# Patient Record
Sex: Male | Born: 1995 | Marital: Single | Smoking: Never smoker
Health system: Southern US, Community
[De-identification: ages and names within clinical notes are randomized; demographics above are authoritative.]

## PROBLEM LIST (undated history)

## (undated) DIAGNOSIS — J45909 Unspecified asthma, uncomplicated: Secondary | ICD-10-CM

## (undated) DIAGNOSIS — Z905 Acquired absence of kidney: Secondary | ICD-10-CM

## (undated) HISTORY — PX: TONSILLECTOMY: SUR1361

## (undated) HISTORY — PX: HERNIA REPAIR: SHX51

---

## 2016-01-12 ENCOUNTER — Other Ambulatory Visit: Payer: Self-pay | Admitting: Nurse Practitioner

## 2016-01-12 DIAGNOSIS — R1084 Generalized abdominal pain: Secondary | ICD-10-CM

## 2016-01-15 ENCOUNTER — Ambulatory Visit
Admission: RE | Admit: 2016-01-15 | Discharge: 2016-01-15 | Disposition: A | Discharge status: Home / Self Care | Payer: BLUE CROSS/BLUE SHIELD | Source: Ambulatory Visit | Attending: Nurse Practitioner | Admitting: Nurse Practitioner

## 2016-01-15 DIAGNOSIS — R1084 Generalized abdominal pain: Secondary | ICD-10-CM

## 2016-01-15 HISTORY — DX: Unspecified asthma, uncomplicated: J45.909

## 2016-01-15 LAB — POCT I-STAT CREATININE: Creatinine, Ser: 0.9 mg/dL (ref 0.61–1.24)

## 2016-01-15 MED ORDER — IOPAMIDOL (ISOVUE-300) INJECTION 61%
85.0000 mL | Freq: Once | INTRAVENOUS | Status: AC | PRN
  Administered 2016-01-15: 75 mL via INTRAVENOUS

## 2016-01-22 ENCOUNTER — Other Ambulatory Visit: Payer: Self-pay | Admitting: Nurse Practitioner

## 2016-01-22 DIAGNOSIS — IMO0002 Reserved for concepts with insufficient information to code with codable children: Secondary | ICD-10-CM

## 2016-01-22 DIAGNOSIS — E298 Other testicular dysfunction: Secondary | ICD-10-CM

## 2016-01-22 DIAGNOSIS — R229 Localized swelling, mass and lump, unspecified: Secondary | ICD-10-CM

## 2016-01-25 ENCOUNTER — Ambulatory Visit
Admission: RE | Admit: 2016-01-25 | Discharge: 2016-01-25 | Disposition: A | Discharge status: Home / Self Care | Payer: BLUE CROSS/BLUE SHIELD | Source: Ambulatory Visit | Attending: Nurse Practitioner | Admitting: Nurse Practitioner

## 2016-01-25 DIAGNOSIS — E298 Other testicular dysfunction: Secondary | ICD-10-CM | POA: Insufficient documentation

## 2016-01-25 DIAGNOSIS — R229 Localized swelling, mass and lump, unspecified: Secondary | ICD-10-CM

## 2016-01-25 DIAGNOSIS — I861 Scrotal varices: Secondary | ICD-10-CM | POA: Diagnosis not present

## 2016-01-25 DIAGNOSIS — IMO0002 Reserved for concepts with insufficient information to code with codable children: Secondary | ICD-10-CM

## 2016-08-07 IMAGING — US US ART/VEN ABD/PELV/SCROTUM DOPPLER LTD
1 series · 13 of 25 positions shown · non-contrast
Comparison: None.

CLINICAL DATA: Bilateral pea-sized scrotal masses which are
intermittently painful. No erythema or swelling of the scrotum is
reported.

EXAM:
SCROTAL ULTRASOUND
DOPPLER ULTRASOUND OF THE TESTICLES
TECHNIQUE: Complete ultrasound examination of the testicles, epididymis, and
other scrotal structures was performed. Color and spectral Doppler
ultrasound were also utilized to evaluate blood flow to the
testicles.

[Series 1: us art/ven abd/pelv/scrotum doppler ltd · 0.08mm/px · 13 of 51 slices shown]
[im 1/51]
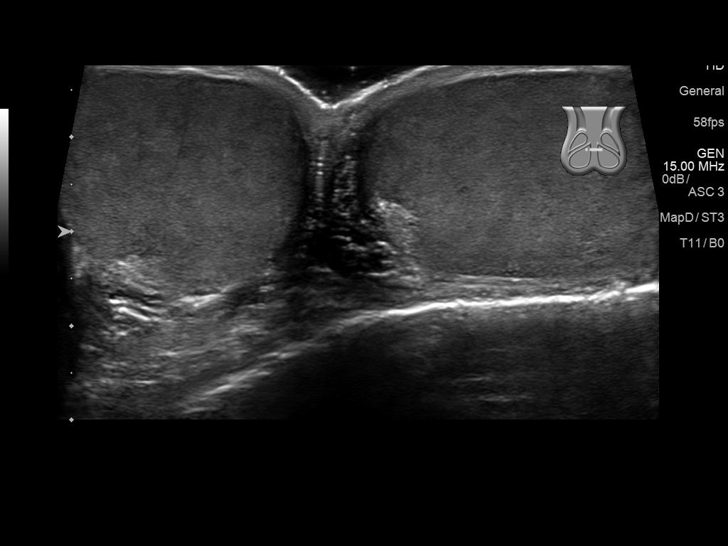
[im 5/51]
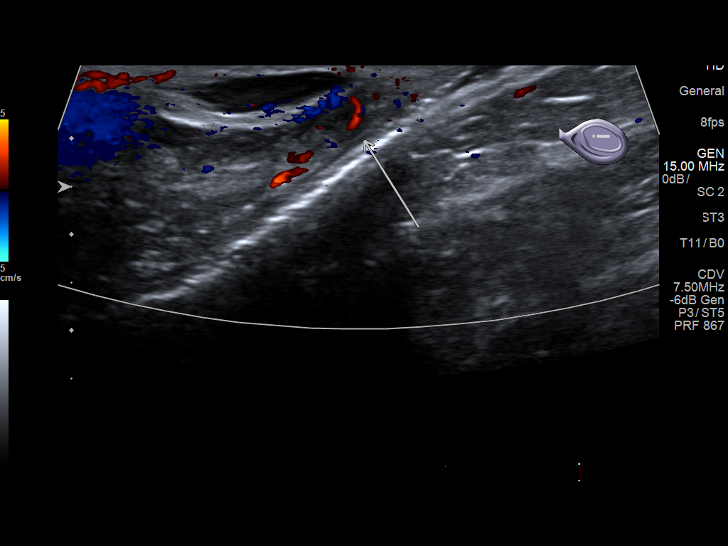
[im 9/51]
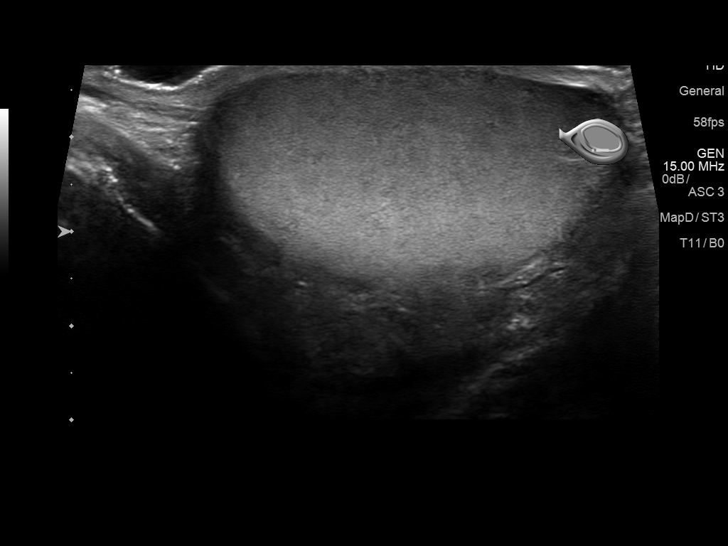
[im 13/51]
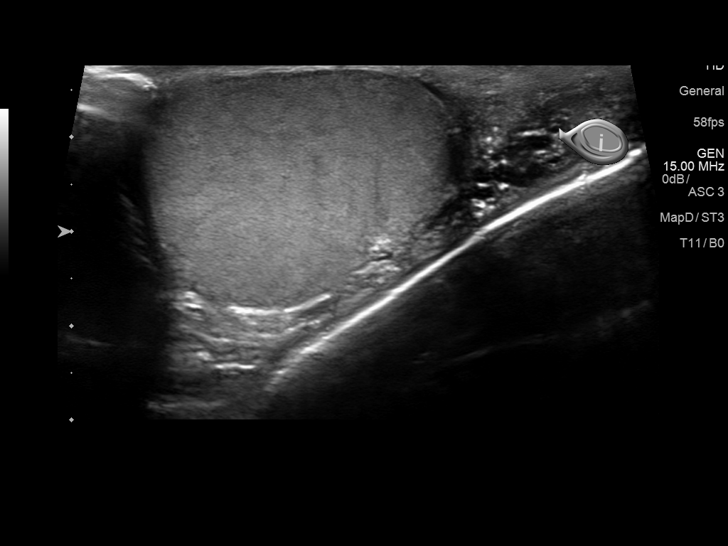
[im 17/51]
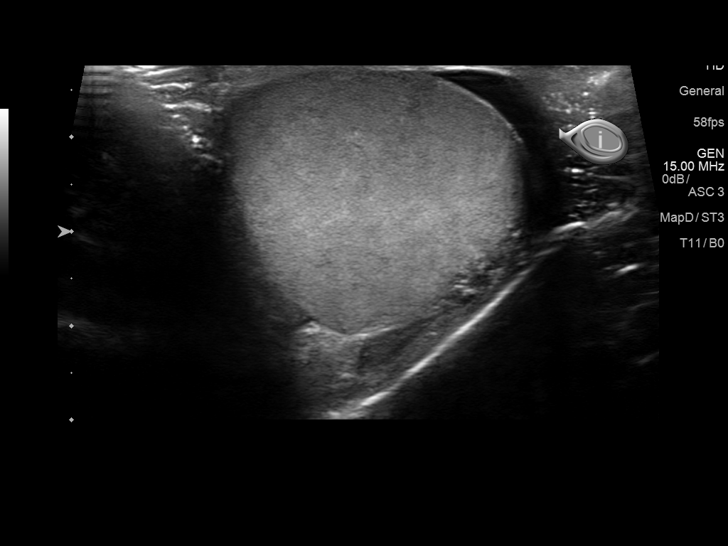
[im 21/51]
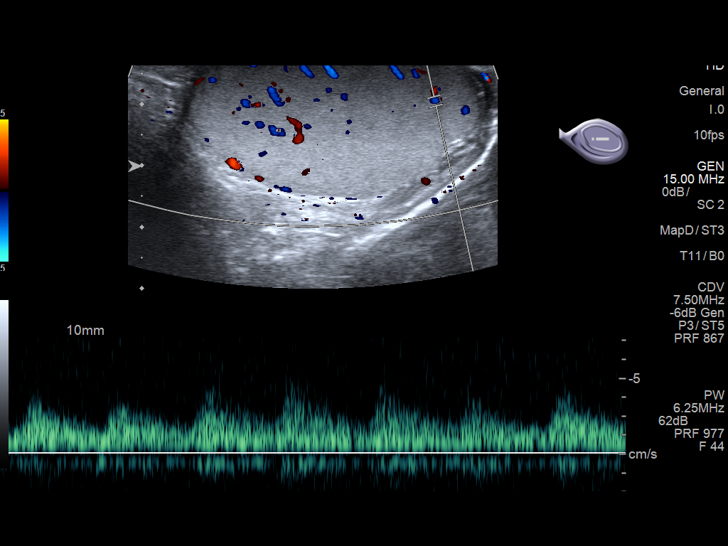
[im 26/51]
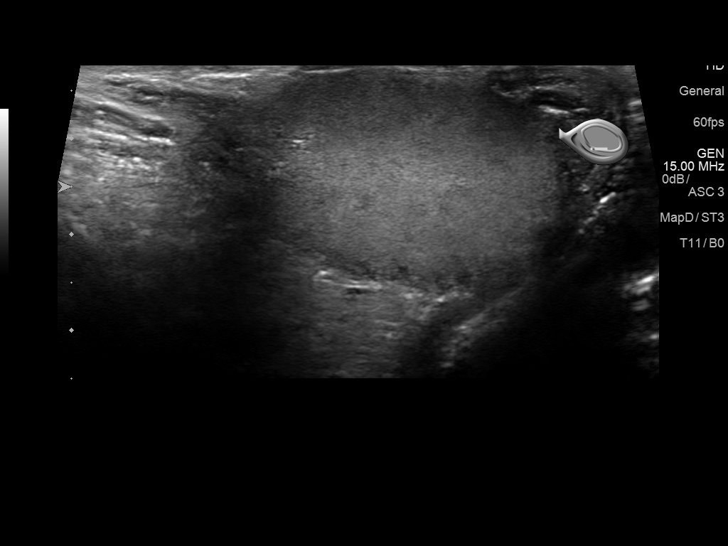
[im 30/51]
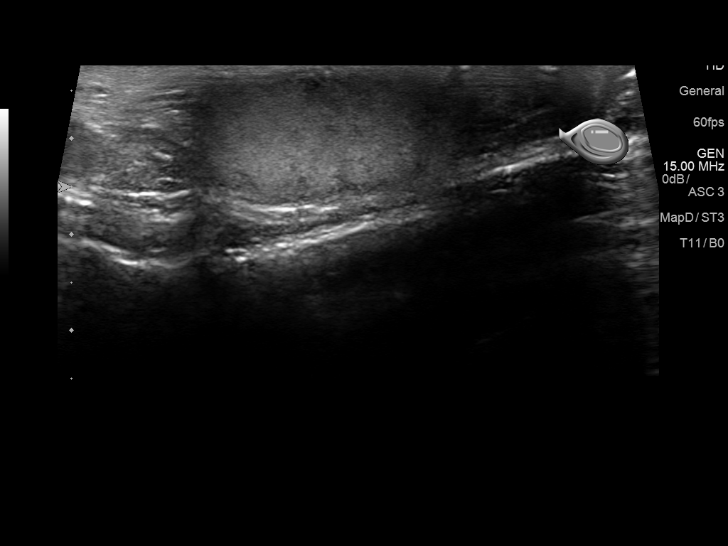
[im 34/51]
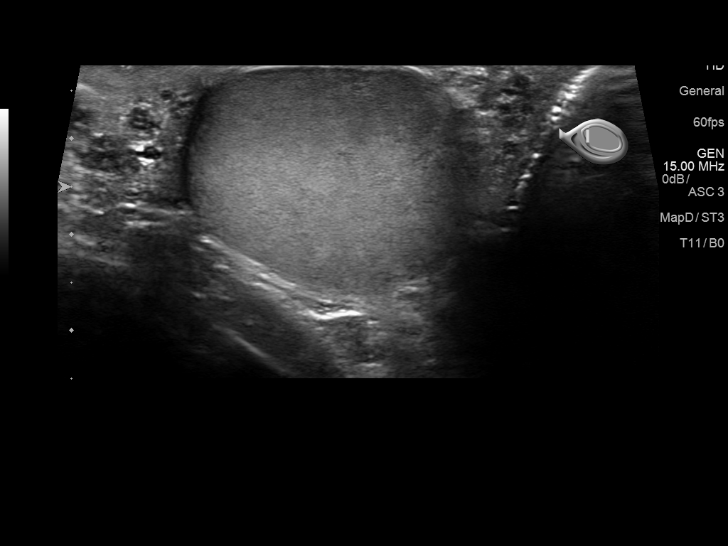
[im 38/51]
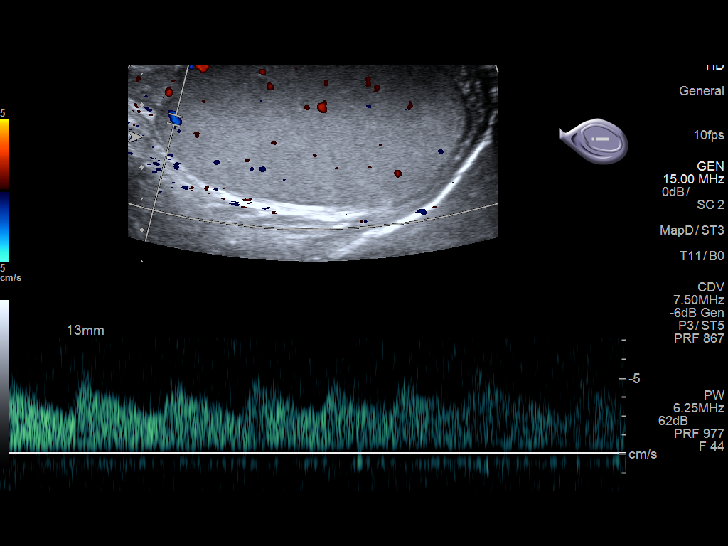
[im 42/51]
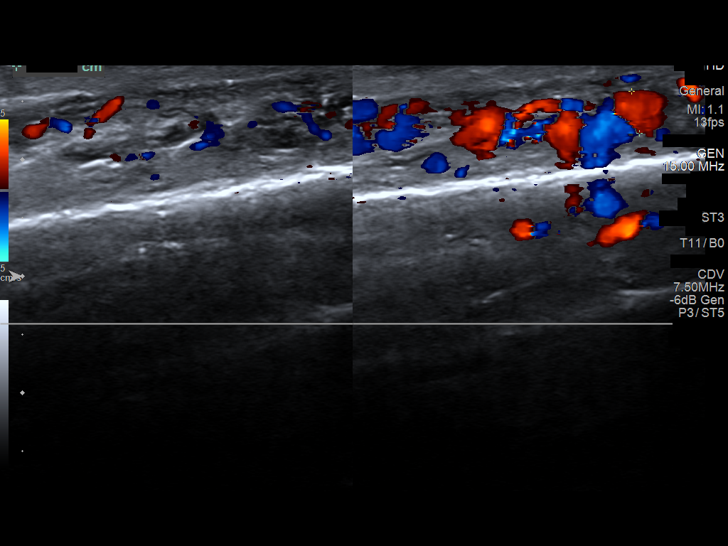
[im 46/51]
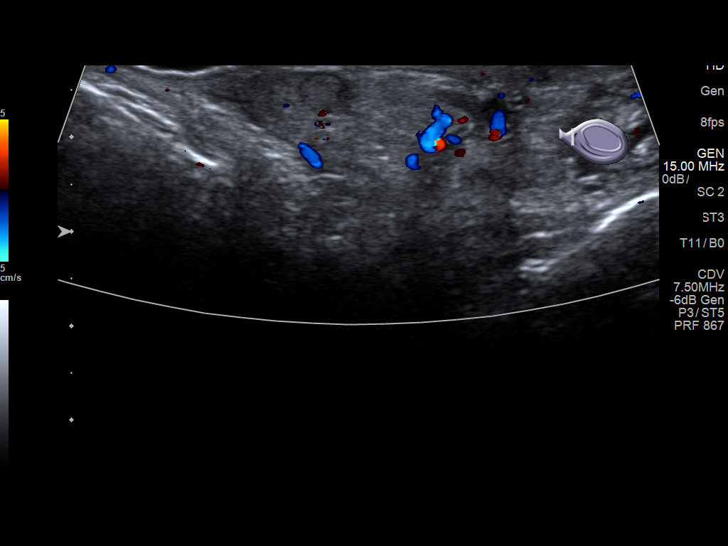
[im 51/51]
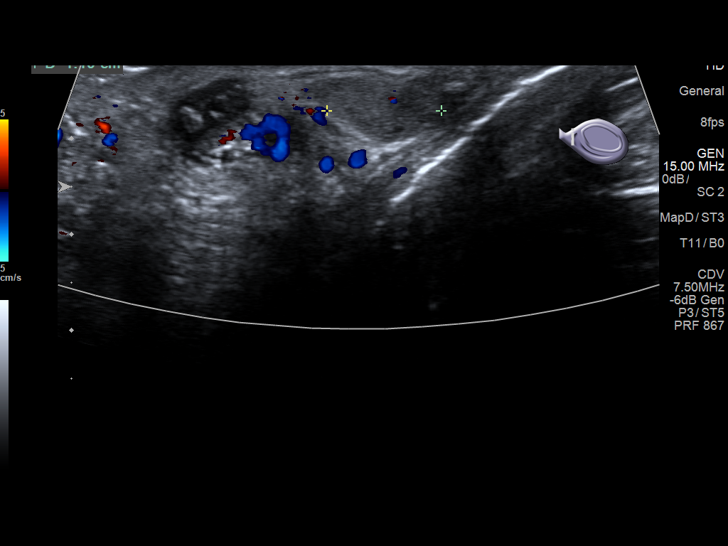

[13 of 25 positions shown; findings below may reference images not displayed]

FINDINGS: Right testicle

Measurements: 4.7 x 2.2 x 3.2 cm. No mass or microlithiasis
visualized.

Left testicle

Measurements: 5.1 x 2.0 x 3.0 cm. No mass or microlithiasis
visualized.

Right epididymis:  Normal in size and appearance.

Left epididymis:  Normal in size and appearance.

Hydrocele:  None visualized.

Varicocele: There are bilateral varicoceles slightly larger on the
right than on the left. These correspond to the areas of palpable
nodularity.

Pulsed Doppler interrogation of both testes demonstrates normal low
resistance arterial and venous waveforms bilaterally.
IMPRESSION: 1. No intra testicular masses nor evidence of testicular torsion.
The epididymal structures are normal. No other scrotal masses are
observed.
2. Bilateral varicoceles slightly larger on the right than on the
left which correspond to the palpable findings.

## 2018-02-15 IMAGING — CT CT ABD-PELV W/ CM
1 of 2 series · 15 of 32 positions shown, 19 images · IV contrast (APPLIED)
Comparison: None.

CLINICAL DATA: Left lower quadrant pain for 1 week. Pain with
breathing. Per report, the patient was born with only a right
kidney.

EXAM:
CT ABDOMEN AND PELVIS WITH CONTRAST
TECHNIQUE: Multidetector CT imaging of the abdomen and pelvis was performed
using the standard protocol following bolus administration of
intravenous contrast.
CONTRAST:  75mL [id number] IOPAMIDOL ([id number]) INJECTION 61%

[Series 2: axial st · axial · 0.64mm/px · z∈[-1022,-607]mm · 15 of 91 slices shown, 19 images]
[im 4/91  soft-tissue]
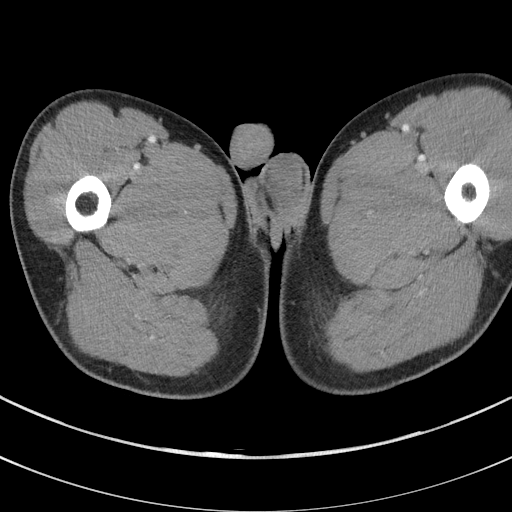
[im 4/91  bone]
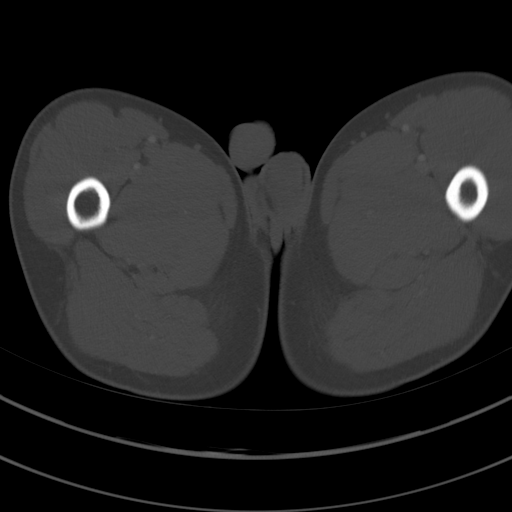
[im 11/91  soft-tissue]
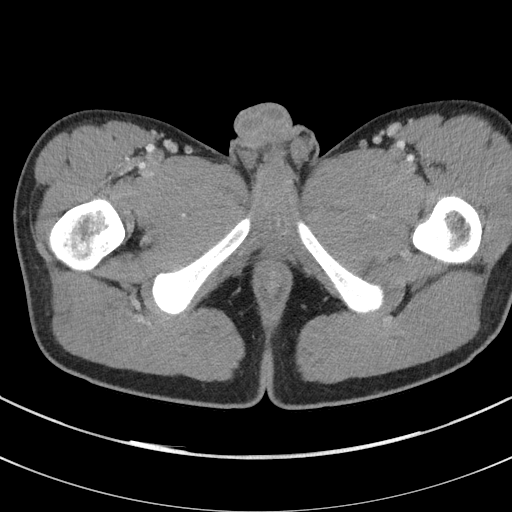
[im 19/91  soft-tissue]
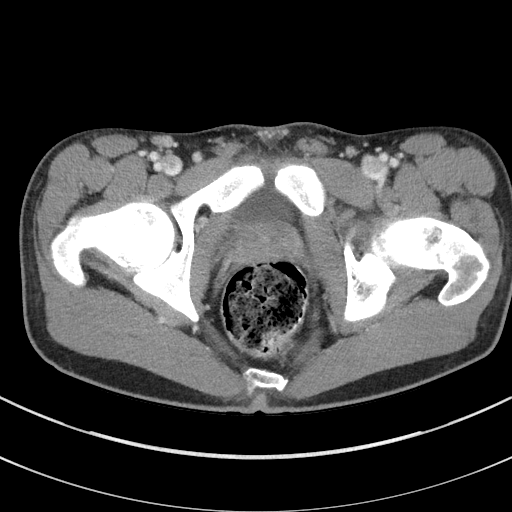
[im 26/91  soft-tissue]
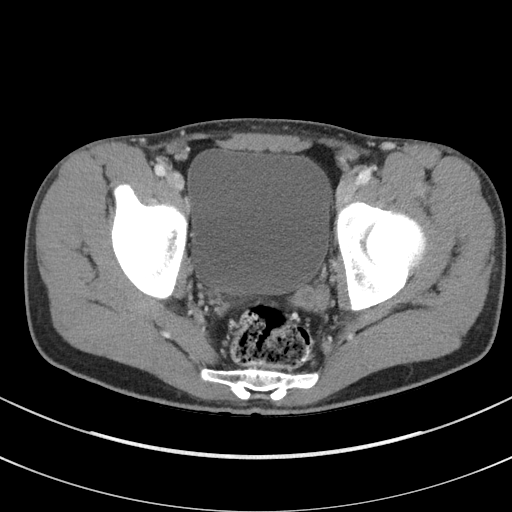
[im 33/91  soft-tissue]
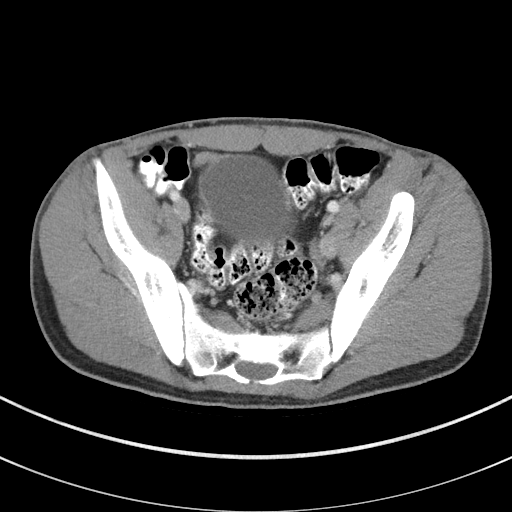
[im 40/91  soft-tissue]
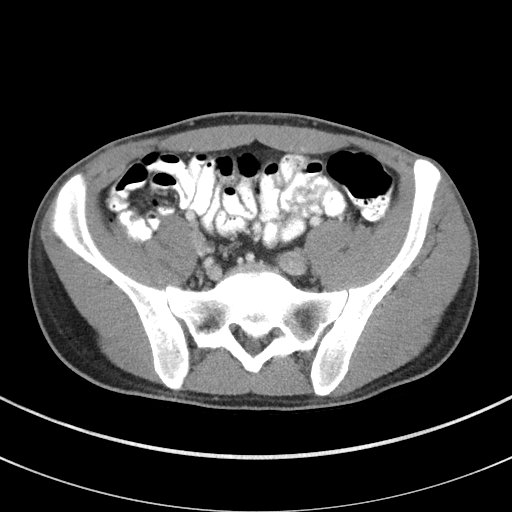
[im 47/91  soft-tissue]
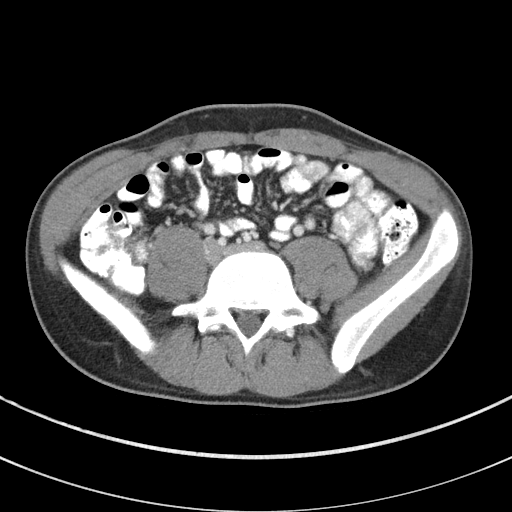
[im 51/91  soft-tissue]
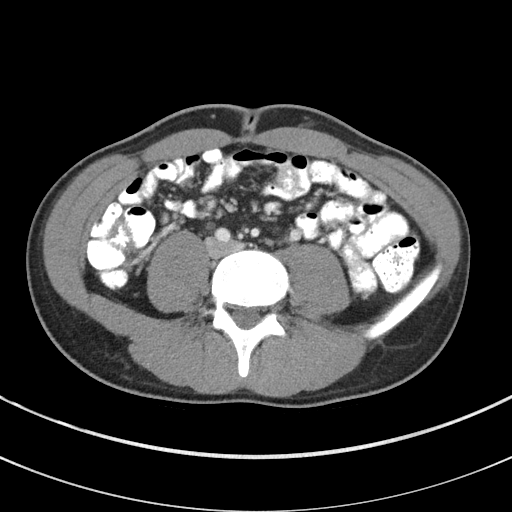
[im 58/91  soft-tissue]
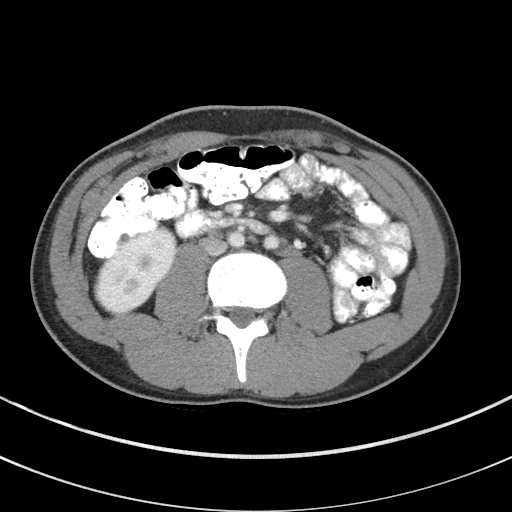
[im 58/91  bone]
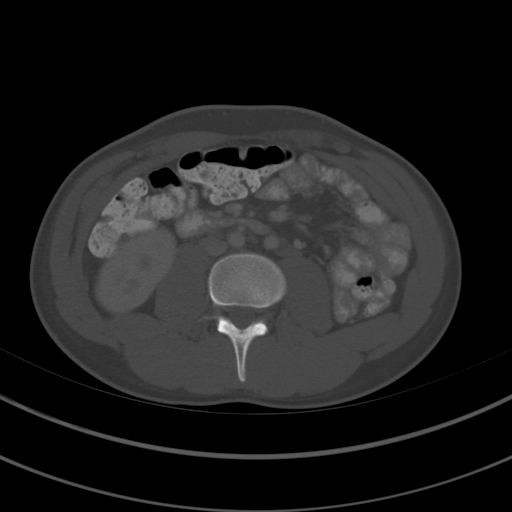
[im 65/91  soft-tissue]
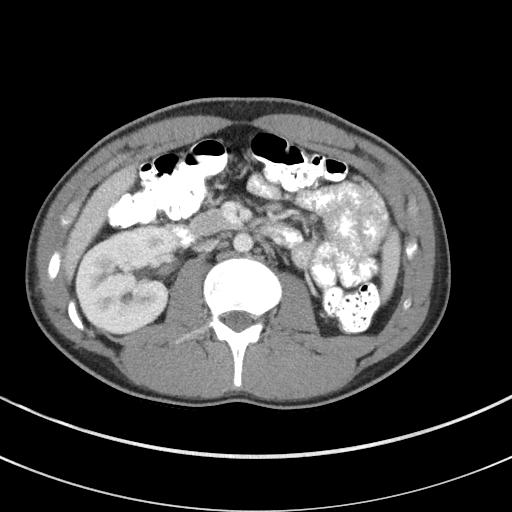
[im 73/91  soft-tissue]
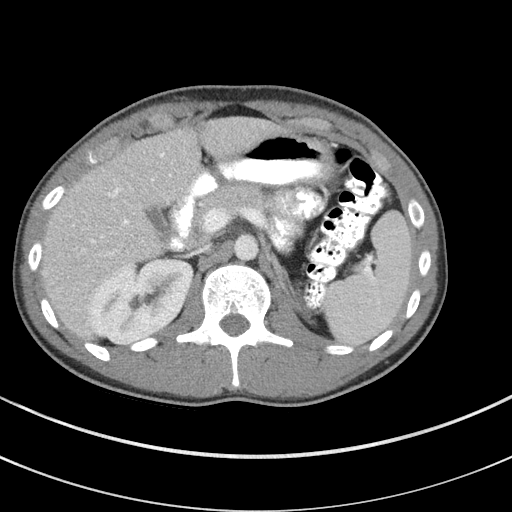
[im 76/91  lung]
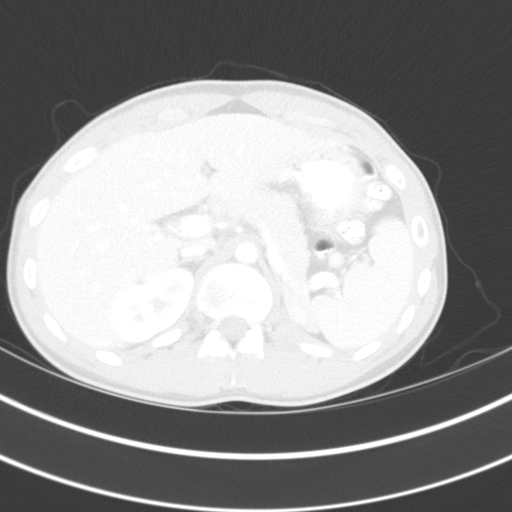
[im 80/91  soft-tissue]
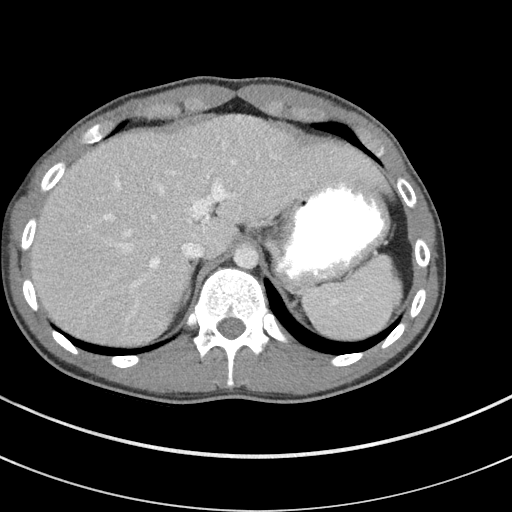
[im 80/91  lung]
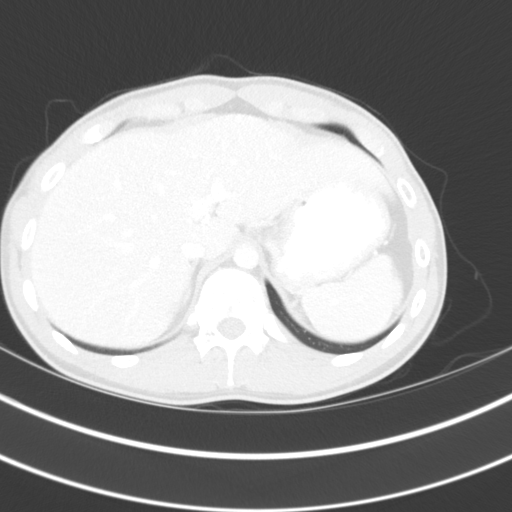
[im 83/91  lung]
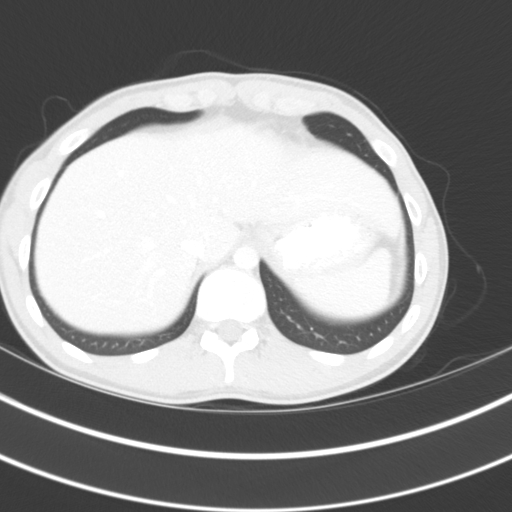
[im 87/91  soft-tissue]
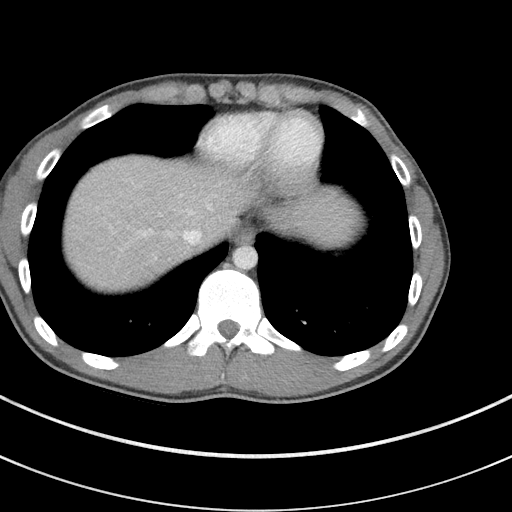
[im 87/91  lung]
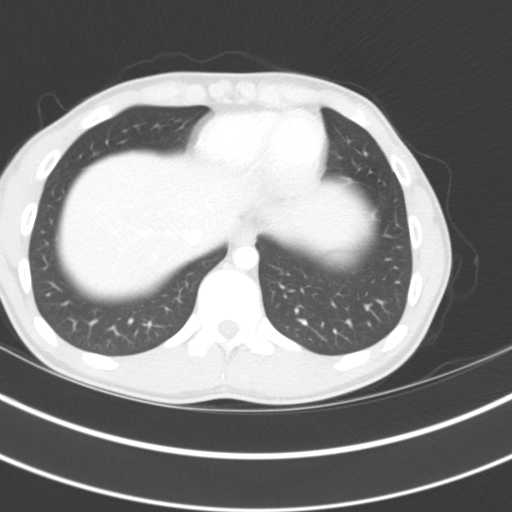

[15 of 32 positions shown; findings below may reference images not displayed]

FINDINGS: Lower chest:  Normal

Hepatobiliary: Normal

Pancreas: Normal

Spleen: Normal

Adrenals/Urinary Tract: The right kidney and the bilateral adrenal
glands are normal in appearance. A normal left kidney is not seen.

Stomach/Bowel: Normal

Vascular/Lymphatic: Normal

Reproductive: No acute abnormalities.

Other: There is soft tissue anterior to the left psoas muscle on
series 2, image 32, likely residua of the left kidney, almost
completely absent due to an utero insult. The soft tissue does track
inferiorly to the level of the left seminal vesicle, also likely
sequela of in utero insult to the left kidney.

Musculoskeletal: Normal
IMPRESSION: 1. No cause for the patient's left lower quadrant pain identified.

## 2021-01-02 ENCOUNTER — Ambulatory Visit
Admission: EM | Admit: 2021-01-02 | Discharge: 2021-01-02 | Disposition: A | Discharge status: Home / Self Care | Payer: BC Managed Care – PPO | Attending: Emergency Medicine | Admitting: Emergency Medicine

## 2021-01-02 ENCOUNTER — Other Ambulatory Visit: Payer: Self-pay

## 2021-01-02 ENCOUNTER — Encounter: Payer: Self-pay | Admitting: Emergency Medicine

## 2021-01-02 DIAGNOSIS — R21 Rash and other nonspecific skin eruption: Secondary | ICD-10-CM | POA: Diagnosis not present

## 2021-01-02 HISTORY — DX: Acquired absence of kidney: Z90.5

## 2021-01-02 MED ORDER — PREDNISONE 10 MG (21) PO TBPK: ORAL_TABLET | Freq: Every day | ORAL | 0 refills | Status: AC

## 2021-01-02 NOTE — ED Provider Notes (Signed)
Dustin Blair    CSN: [id number] Arrival date & time: 01/02/21  1334      History   Chief Complaint Chief Complaint  Patient presents with  . Rash    HPI Dustin Blair is a 25 y.o. male.   Patient presents with a mildly pruritic, nontender rash on his abdomen, back, upper arms, upper thighs for 1 month.  The rash started after he switched soaps.  He states the rash has spread over time; it has spread to the back of his scalp.  No associated hearing loss.  No drainage.  Treatment attempted at home with a prescription cream that belongs to his mother; he does not know the name.  He also has taken 2 doses of Benadryl over the last month.  He denies fever, chills, sore throat, cough, shortness of breath, vomiting, diarrhea, or other symptoms.  The history is provided by the patient.    Past Medical History:  Diagnosis Date  . Asthma   . History of kidney removal     There are no problems to display for this patient.   Past Surgical History:  Procedure Laterality Date  . HERNIA REPAIR    . TONSILLECTOMY         Home Medications    Prior to Admission medications   Medication Sig Start Date End Date Taking? Authorizing Provider  predniSONE (STERAPRED UNI-PAK 21 TAB) 10 MG (21) TBPK tablet Take by mouth daily. As directed 01/02/21  Yes Mickie Bail, NP    Family History History reviewed. No pertinent family history.  Social History Social History   Tobacco Use  . Smoking status: Never Smoker  . Smokeless tobacco: Current User    Types: Snuff  Vaping Use  . Vaping Use: Every day  . Substances: Nicotine  Substance Use Topics  . Alcohol use: Yes    Alcohol/week: 5.0 standard drinks    Types: 5 Cans of beer per week  . Drug use: Never     Allergies   Patient has no known allergies.   Review of Systems Review of Systems  Constitutional: Negative for chills and fever.  HENT: Negative for ear pain and sore throat.   Eyes: Negative for pain and  visual disturbance.  Respiratory: Negative for cough and shortness of breath.   Cardiovascular: Negative for chest pain and palpitations.  Gastrointestinal: Negative for abdominal pain and vomiting.  Genitourinary: Negative for dysuria and hematuria.  Musculoskeletal: Negative for arthralgias and back pain.  Skin: Positive for rash. Negative for color change.  Neurological: Negative for seizures and syncope.  All other systems reviewed and are negative.    Physical Exam Triage Vital Signs ED Triage Vitals  Enc Vitals Group     BP      Pulse      Resp      Temp      Temp src      SpO2      Weight      Height      Head Circumference      Peak Flow      Pain Score      Pain Loc      Pain Edu?      Excl. in GC?    No data found.  Updated Vital Signs BP 119/77 (BP Location: Left Arm)   Pulse 84   Temp 98.6 F (37 C) (Oral)   Resp 16   SpO2 96%   Visual Acuity Right Eye Distance:  Left Eye Distance:   Bilateral Distance:    Right Eye Near:   Left Eye Near:    Bilateral Near:     Physical Exam Vitals and nursing note reviewed.  Constitutional:      General: He is not in acute distress.    Appearance: He is well-developed. He is not ill-appearing.  HENT:     Head: Normocephalic and atraumatic.     Right Ear: Tympanic membrane normal.     Left Ear: Tympanic membrane normal.     Nose: Nose normal.     Mouth/Throat:     Mouth: Mucous membranes are moist.     Pharynx: Oropharynx is clear.  Eyes:     Conjunctiva/sclera: Conjunctivae normal.  Cardiovascular:     Rate and Rhythm: Normal rate and regular rhythm.     Heart sounds: Normal heart sounds.  Pulmonary:     Effort: Pulmonary effort is normal. No respiratory distress.     Breath sounds: Normal breath sounds.  Abdominal:     Palpations: Abdomen is soft.     Tenderness: There is no abdominal tenderness.  Musculoskeletal:        General: Normal range of motion.     Cervical back: Neck supple.   Skin:    General: Skin is warm and dry.     Findings: Rash present.     Comments: Scattered pink maculopapular and patchy rash on abdomen, back, posterior neck, posterior scalp, upper arms, upper thighs.  Blanches.  No drainage.  No alopecia.  Neurological:     General: No focal deficit present.     Mental Status: He is alert and oriented to person, place, and time.     Gait: Gait normal.  Psychiatric:        Mood and Affect: Mood normal.        Behavior: Behavior normal.      UC Treatments / Results  Labs (all labs ordered are listed, but only abnormal results are displayed) Labs Reviewed - No data to display  EKG   Radiology No results found.  Procedures Procedures (including critical care time)  Medications Ordered in UC Medications - No data to display  Initial Impression / Assessment and Plan / UC Course  I have reviewed the triage vital signs and the nursing notes.  Pertinent labs & imaging results that were available during my care of the patient were reviewed by me and considered in my medical decision making (see chart for details).   Rash.  Treating with prednisone taper.  Also instructed patient to take Claritin or Allegra each morning and Benadryl at bedtime for 7 days.  Precautions for drowsiness with Benadryl discussed.  Instructed patient to use Dove for sensitive skin soap.  Instructed him to follow-up with a dermatologist if his symptoms are not improving.  He agrees to plan of care.   Final Clinical Impressions(s) / UC Diagnoses   Final diagnoses:  Rash     Discharge Instructions     Take the prednisone as directed.    Additionally, take Claritin or Allegra each morning and Benadryl at bedtime for the next 7 days.  Do not drive, operate machinery, or drink alcohol with Benadryl as it may cause drowsiness.    Follow-up with a dermatologist such as the one listed below if your symptoms are not improving.        ED Prescriptions     Medication Sig Dispense Auth. Provider   predniSONE (STERAPRED UNI-PAK 21 TAB) 10  MG (21) TBPK tablet Take by mouth daily. As directed 21 tablet Mickie Bail, NP     PDMP not reviewed this encounter.   Mickie Bail, NP 01/02/21 1416

## 2021-01-02 NOTE — Discharge Instructions (Signed)
Take the prednisone as directed.    Additionally, take Claritin or Allegra each morning and Benadryl at bedtime for the next 7 days.  Do not drive, operate machinery, or drink alcohol with Benadryl as it may cause drowsiness.    Follow-up with a dermatologist such as the one listed below if your symptoms are not improving.

## 2021-01-02 NOTE — ED Triage Notes (Signed)
Patient c/o Rash " on back and abdomen" x 4 weeks.   Patient endorses itchiness at times.   Patient endorses changing soaps that may have flared up the rash.   Patient denies any areas of weeping or drainage.   Patient has used a "a prescription ointment" and benadryl with no relief of symptoms.

## 2021-01-09 ENCOUNTER — Ambulatory Visit: Payer: PRIVATE HEALTH INSURANCE | Admitting: Dermatology

## 2022-08-22 ENCOUNTER — Ambulatory Visit
Admission: EM | Admit: 2022-08-22 | Discharge: 2022-08-22 | Disposition: A | Discharge status: Home / Self Care | Payer: Managed Care, Other (non HMO) | Attending: Emergency Medicine | Admitting: Emergency Medicine

## 2022-08-22 DIAGNOSIS — J101 Influenza due to other identified influenza virus with other respiratory manifestations: Secondary | ICD-10-CM | POA: Diagnosis not present

## 2022-08-22 LAB — GROUP A STREP BY PCR: Group A Strep by PCR: NOT DETECTED

## 2022-08-22 LAB — RAPID INFLUENZA A&B ANTIGENS
Influenza A (ARMC): POSITIVE — AB
Influenza B (ARMC): NEGATIVE

## 2022-08-22 MED ORDER — PROMETHAZINE HCL 12.5 MG PO TABS: 12.5000 mg | ORAL_TABLET | Freq: Three times a day (TID) | ORAL | 0 refills | Status: AC | PRN

## 2022-08-22 MED ORDER — OSELTAMIVIR PHOSPHATE 75 MG PO CAPS: 75.0000 mg | ORAL_CAPSULE | Freq: Two times a day (BID) | ORAL | 0 refills | Status: AC

## 2022-08-22 NOTE — ED Triage Notes (Signed)
Pt c/o sore throat started yesterday & fever onset last night highest being 103.6 around 30 mins ago, 3 episodes of emesis. Has tried zofran & ibuprofen last dose around 3pm. No known exposure.

## 2022-08-22 NOTE — Discharge Instructions (Addendum)
For influenza A, this is  a virus and should steadily improve in time it can take up to 7 to 10 days before you truly start to see a turnaround however things will get better  Strep test was negative  Begin Tamiflu every morning and every evening for 5 days, daily this will reduce the amount of virus in your body minimizing your symptoms and the timeline that you are sick, does not fully take away illness  May use Phenergan every 8 hours to help with vomiting, be mindful this will make you drowsy    You can take Tylenol and/or Ibuprofen as needed for fever reduction and pain relief.   For cough: honey 1/2 to 1 teaspoon (you can dilute the honey in water or another fluid).  You can also use guaifenesin and dextromethorphan for cough. You can use a humidifier for chest congestion and cough.  If you don't have a humidifier, you can sit in the bathroom with the hot shower running.      For sore throat: try warm salt water gargles, cepacol lozenges, throat spray, warm tea or water with lemon/honey, popsicles or ice, or OTC cold relief medicine for throat discomfort.   For congestion: take a daily anti-histamine like Zyrtec, Claritin, and a oral decongestant, such as pseudoephedrine.  You can also use Flonase 1-2 sprays in each nostril daily.   It is important to stay hydrated: drink plenty of fluids (water, gatorade/powerade/pedialyte, juices, or teas) to keep your throat moisturized and help further relieve irritation/discomfort.

## 2022-08-22 NOTE — ED Provider Notes (Signed)
[facility]    CSN: [id number] Arrival date & time: 08/22/22  1541      History   Chief Complaint Chief Complaint  Patient presents with   Fever   Sore Throat    HPI Dustin Blair is a 26 y.o. male.   Presents for evaluation of fever, sore throat, cough, vomiting and headaches beginning 1 day ago.  Last occurrence of vomiting at approximately 11 AM, has been able to tolerate fluids, has not attempted to eat.  Cough is nonproductive.  Fever peaking at 103.6.  Has attempted use of Zofran and ibuprofen which has been ineffective.  History of asthma.  Denies shortness of breath or wheezing, nasal congestion, ear pain.  No sick contacts.  Past Medical History:  Diagnosis Date   Asthma    History of kidney removal     There are no problems to display for this patient.   Past Surgical History:  Procedure Laterality Date   HERNIA REPAIR     TONSILLECTOMY         Home Medications    Prior to Admission medications   Medication Sig Start Date End Date Taking? Authorizing Provider  predniSONE (STERAPRED UNI-PAK 21 TAB) 10 MG (21) TBPK tablet Take by mouth daily. As directed 01/02/21   Mickie Bail, NP    Family History History reviewed. No pertinent family history.  Social History Social History   Tobacco Use   Smoking status: Never   Smokeless tobacco: Current    Types: Snuff  Vaping Use   Vaping Use: Every day   Substances: Nicotine  Substance Use Topics   Alcohol use: Yes    Alcohol/week: 5.0 standard drinks of alcohol    Types: 5 Cans of beer per week   Drug use: Never     Allergies   Patient has no known allergies.   Review of Systems Review of Systems  Constitutional:  Positive for fever. Negative for activity change, appetite change, chills, diaphoresis, fatigue and unexpected weight change.  HENT:  Positive for sore throat. Negative for congestion, dental problem, drooling, ear discharge, ear pain, facial swelling, hearing loss, mouth  sores, nosebleeds, postnasal drip, rhinorrhea, sinus pressure, sinus pain, sneezing, tinnitus, trouble swallowing and voice change.   Respiratory:  Positive for cough. Negative for apnea, choking, chest tightness, shortness of breath, wheezing and stridor.   Cardiovascular: Negative.   Gastrointestinal:  Positive for vomiting. Negative for abdominal distention, abdominal pain, anal bleeding, blood in stool, constipation, diarrhea, nausea and rectal pain.  Neurological:  Positive for headaches.     Physical Exam Triage Vital Signs ED Triage Vitals  Enc Vitals Group     BP 08/22/22 1607 109/68     Pulse Rate 08/22/22 1607 (!) 108     Resp 08/22/22 1607 16     Temp 08/22/22 1607 99.2 F (37.3 C)     Temp Source 08/22/22 1607 Oral     SpO2 08/22/22 1607 95 %     Weight 08/22/22 1606 165 lb (74.8 kg)     Height 08/22/22 1606 5\' 11"  (1.803 m)     Head Circumference --      Peak Flow --      Pain Score 08/22/22 1605 6     Pain Loc --      Pain Edu? --      Excl. in GC? --    No data found.  Updated Vital Signs BP 109/68 (BP Location: Left Arm)   Pulse 08/24/22)  108   Temp 99.2 F (37.3 C) (Oral)   Resp 16   Ht 5\' 11"  (1.803 m)   Wt 165 lb (74.8 kg)   SpO2 95%   BMI 23.01 kg/m   Visual Acuity Right Eye Distance:   Left Eye Distance:   Bilateral Distance:    Right Eye Near:   Left Eye Near:    Bilateral Near:     Physical Exam Constitutional:      Appearance: Normal appearance. He is well-developed.  HENT:     Head: Normocephalic.     Right Ear: Tympanic membrane and ear canal normal.     Left Ear: Tympanic membrane and ear canal normal.     Nose: No congestion or rhinorrhea.     Mouth/Throat:     Mouth: Mucous membranes are moist.     Pharynx: Posterior oropharyngeal erythema present.     Tonsils: No tonsillar exudate. 0 on the right. 0 on the left.  Cardiovascular:     Rate and Rhythm: Normal rate and regular rhythm.     Heart sounds: Normal heart sounds.   Pulmonary:     Effort: Pulmonary effort is normal.     Breath sounds: Normal breath sounds.  Musculoskeletal:     Cervical back: Normal range of motion and neck supple.  Skin:    General: Skin is warm and dry.  Neurological:     General: No focal deficit present.     Mental Status: He is alert and oriented to person, place, and time.  Psychiatric:        Mood and Affect: Mood normal.        Behavior: Behavior normal.      UC Treatments / Results  Labs (all labs ordered are listed, but only abnormal results are displayed) Labs Reviewed  GROUP A STREP BY PCR  RAPID INFLUENZA A&B ANTIGENS    EKG   Radiology No results found.  Procedures Procedures (including critical care time)  Medications Ordered in UC Medications - No data to display  Initial Impression / Assessment and Plan / UC Course  I have reviewed the triage vital signs and the nursing notes.  Pertinent labs & imaging results that were available during my care of the patient were reviewed by me and considered in my medical decision making (see chart for details).  Influenza A  Confirmed by rapid testing, strep PCR negative, prescribed Tamiflu and Phenergan as Zofran has been ineffective, discussed administration, recommended supportive measures and increase fluid intake, may attempt use of additional over-the-counter medications as needed, may follow-up with urgent care as needed, work note given Final Clinical Impressions(s) / UC Diagnoses   Final diagnoses:  None   Discharge Instructions   None    ED Prescriptions   None    PDMP not reviewed this encounter.   , NP 08/22/22 1651
